# Patient Record
Sex: Female | Born: 1965 | Hispanic: No | Marital: Married
Health system: Southern US, Community
[De-identification: ages and names within clinical notes are randomized; demographics above are authoritative.]

---

## 2012-03-26 ENCOUNTER — Other Ambulatory Visit (HOSPITAL_COMMUNITY)
Admission: RE | Admit: 2012-03-26 | Discharge: 2012-03-26 | Disposition: A | Discharge status: Home / Self Care | Payer: Commercial Indemnity | Source: Ambulatory Visit | Attending: Obstetrics and Gynecology | Admitting: Obstetrics and Gynecology

## 2012-03-26 DIAGNOSIS — Z01419 Encounter for gynecological examination (general) (routine) without abnormal findings: Secondary | ICD-10-CM | POA: Insufficient documentation

## 2012-03-26 DIAGNOSIS — Z1151 Encounter for screening for human papillomavirus (HPV): Secondary | ICD-10-CM | POA: Insufficient documentation

## 2013-04-01 ENCOUNTER — Other Ambulatory Visit (HOSPITAL_COMMUNITY)
Admission: RE | Admit: 2013-04-01 | Discharge: 2013-04-01 | Disposition: A | Discharge status: Home / Self Care | Payer: Commercial Indemnity | Source: Ambulatory Visit | Attending: Obstetrics and Gynecology | Admitting: Obstetrics and Gynecology

## 2013-04-01 ENCOUNTER — Other Ambulatory Visit: Payer: Self-pay | Admitting: Obstetrics and Gynecology

## 2013-04-01 DIAGNOSIS — Z01419 Encounter for gynecological examination (general) (routine) without abnormal findings: Secondary | ICD-10-CM | POA: Insufficient documentation

## 2013-04-01 DIAGNOSIS — Z1151 Encounter for screening for human papillomavirus (HPV): Secondary | ICD-10-CM | POA: Insufficient documentation

## 2014-04-05 ENCOUNTER — Other Ambulatory Visit: Payer: Self-pay | Admitting: Obstetrics and Gynecology

## 2014-04-05 ENCOUNTER — Other Ambulatory Visit (HOSPITAL_COMMUNITY)
Admission: RE | Admit: 2014-04-05 | Discharge: 2014-04-05 | Disposition: A | Discharge status: Home / Self Care | Payer: Managed Care, Other (non HMO) | Source: Ambulatory Visit | Attending: Obstetrics and Gynecology | Admitting: Obstetrics and Gynecology

## 2014-04-05 DIAGNOSIS — Z01419 Encounter for gynecological examination (general) (routine) without abnormal findings: Secondary | ICD-10-CM | POA: Diagnosis present

## 2014-04-07 LAB — CYTOLOGY - PAP

## 2014-07-19 ENCOUNTER — Other Ambulatory Visit: Payer: Self-pay | Admitting: Internal Medicine

## 2014-07-19 ENCOUNTER — Ambulatory Visit
Admission: RE | Admit: 2014-07-19 | Discharge: 2014-07-19 | Disposition: A | Discharge status: Home / Self Care | Payer: Managed Care, Other (non HMO) | Source: Ambulatory Visit | Attending: Internal Medicine | Admitting: Internal Medicine

## 2014-07-19 DIAGNOSIS — J209 Acute bronchitis, unspecified: Secondary | ICD-10-CM

## 2015-11-28 ENCOUNTER — Other Ambulatory Visit: Payer: Self-pay | Admitting: Obstetrics and Gynecology

## 2015-11-28 DIAGNOSIS — Z1231 Encounter for screening mammogram for malignant neoplasm of breast: Secondary | ICD-10-CM

## 2015-11-29 ENCOUNTER — Ambulatory Visit: Payer: Managed Care, Other (non HMO)

## 2015-12-19 ENCOUNTER — Ambulatory Visit
Admission: RE | Admit: 2015-12-19 | Discharge: 2015-12-19 | Disposition: A | Discharge status: Home / Self Care | Payer: Managed Care, Other (non HMO) | Source: Ambulatory Visit | Attending: Obstetrics and Gynecology | Admitting: Obstetrics and Gynecology

## 2015-12-19 DIAGNOSIS — Z1231 Encounter for screening mammogram for malignant neoplasm of breast: Secondary | ICD-10-CM

## 2016-04-11 ENCOUNTER — Other Ambulatory Visit (HOSPITAL_COMMUNITY)
Admission: RE | Admit: 2016-04-11 | Discharge: 2016-04-11 | Disposition: A | Discharge status: Home / Self Care | Payer: BLUE CROSS/BLUE SHIELD | Source: Ambulatory Visit | Attending: Obstetrics and Gynecology | Admitting: Obstetrics and Gynecology

## 2016-04-11 ENCOUNTER — Other Ambulatory Visit: Payer: Self-pay | Admitting: Obstetrics and Gynecology

## 2016-04-11 DIAGNOSIS — Z01419 Encounter for gynecological examination (general) (routine) without abnormal findings: Secondary | ICD-10-CM | POA: Insufficient documentation

## 2016-04-11 DIAGNOSIS — Z1151 Encounter for screening for human papillomavirus (HPV): Secondary | ICD-10-CM | POA: Diagnosis not present

## 2016-04-17 LAB — CYTOLOGY - PAP
Diagnosis: NEGATIVE
HPV (WINDOPATH): NOT DETECTED

## 2018-11-14 ENCOUNTER — Other Ambulatory Visit: Payer: Self-pay | Admitting: Obstetrics and Gynecology

## 2018-11-14 DIAGNOSIS — Z1231 Encounter for screening mammogram for malignant neoplasm of breast: Secondary | ICD-10-CM

## 2018-12-31 ENCOUNTER — Ambulatory Visit
Admission: RE | Admit: 2018-12-31 | Discharge: 2018-12-31 | Disposition: A | Discharge status: Home / Self Care | Payer: BC Managed Care – PPO | Source: Ambulatory Visit | Attending: Obstetrics and Gynecology | Admitting: Obstetrics and Gynecology

## 2018-12-31 ENCOUNTER — Other Ambulatory Visit: Payer: Self-pay

## 2018-12-31 DIAGNOSIS — Z1231 Encounter for screening mammogram for malignant neoplasm of breast: Secondary | ICD-10-CM

## 2019-04-16 ENCOUNTER — Ambulatory Visit: Payer: Managed Care, Other (non HMO) | Attending: Family

## 2019-04-16 DIAGNOSIS — Z23 Encounter for immunization: Secondary | ICD-10-CM

## 2019-04-16 NOTE — Progress Notes (Signed)
   Covid-19 Vaccination Clinic  Name:  Bethany Gallegos    MRN: [id number] DOB: [birth year]  04/16/2019  Ms. Bethany Gallegos was observed post Covid-19 immunization for 15 minutes without incident. She was provided with Vaccine Information Sheet and instruction to access the V-Safe system.   Ms. Bethany Gallegos was instructed to call 911 with any severe reactions post vaccine: Marland Kitchen Difficulty breathing  . Swelling of face and throat  . A fast heartbeat  . A bad rash all over body  . Dizziness and weakness   Immunizations Administered    Name Date Dose VIS Date Route   Moderna COVID-19 Vaccine 04/16/2019 11:37 AM 0.5 mL 01/06/2019 Intramuscular   Manufacturer: Moderna   Lot: [id number]   NDC: 96283-662-94

## 2019-05-19 ENCOUNTER — Ambulatory Visit: Payer: Managed Care, Other (non HMO)

## 2019-05-26 ENCOUNTER — Ambulatory Visit: Payer: Managed Care, Other (non HMO) | Attending: Family

## 2019-05-26 DIAGNOSIS — Z23 Encounter for immunization: Secondary | ICD-10-CM

## 2019-05-26 NOTE — Progress Notes (Signed)
   Covid-19 Vaccination Clinic  Name:  Bethany Gallegos    MRN: [id number] DOB: 08-14-1965  05/26/2019  Ms. Bethany Gallegos was observed post Covid-19 immunization for 15 minutes without incident. She was provided with Vaccine Information Sheet and instruction to access the V-Safe system.   Ms. Bethany Gallegos was instructed to call 911 with any severe reactions post vaccine: Marland Kitchen Difficulty breathing  . Swelling of face and throat  . A fast heartbeat  . A bad rash all over body  . Dizziness and weakness   Immunizations Administered    Name Date Dose VIS Date Route   Moderna COVID-19 Vaccine 05/26/2019 11:23 AM 0.5 mL 01/2019 Intramuscular   Manufacturer: Moderna   Lot: [id number]   NDC: 50871-994-12

## 2020-02-10 ENCOUNTER — Ambulatory Visit: Payer: Managed Care, Other (non HMO) | Attending: Family

## 2020-02-10 DIAGNOSIS — Z23 Encounter for immunization: Secondary | ICD-10-CM

## 2020-06-03 NOTE — Progress Notes (Signed)
   Covid-19 Vaccination Clinic  Name:  Bethany Gallegos    MRN: [id number] DOB: 11-11-1965  06/03/2020  Ms. Bethany Gallegos was observed post Covid-19 immunization for 15 minutes without incident. She was provided with Vaccine Information Sheet and instruction to access the V-Safe system.   Ms. Bethany Gallegos was instructed to call 911 with any severe reactions post vaccine: Marland Kitchen Difficulty breathing  . Swelling of face and throat  . A fast heartbeat  . A bad rash all over body  . Dizziness and weakness   Immunizations Administered    Name Date Dose VIS Date Route   Moderna Covid-19 Booster Vaccine 02/10/2020  2:30 PM 0.25 mL 11/25/2019 Intramuscular   Manufacturer: Moderna   Lot: [id number]   NDC: 23536-144-31

## 2020-11-10 ENCOUNTER — Other Ambulatory Visit: Payer: Self-pay | Admitting: Internal Medicine

## 2020-11-10 DIAGNOSIS — Z1231 Encounter for screening mammogram for malignant neoplasm of breast: Secondary | ICD-10-CM

## 2020-12-09 ENCOUNTER — Other Ambulatory Visit: Payer: Self-pay

## 2020-12-09 ENCOUNTER — Ambulatory Visit
Admission: RE | Admit: 2020-12-09 | Discharge: 2020-12-09 | Disposition: A | Discharge status: Home / Self Care | Payer: BC Managed Care – PPO | Source: Ambulatory Visit | Attending: Internal Medicine | Admitting: Internal Medicine

## 2020-12-09 DIAGNOSIS — Z1231 Encounter for screening mammogram for malignant neoplasm of breast: Secondary | ICD-10-CM

## 2021-01-12 ENCOUNTER — Ambulatory Visit: Payer: BC Managed Care – PPO | Attending: Family

## 2021-01-12 DIAGNOSIS — Z23 Encounter for immunization: Secondary | ICD-10-CM

## 2021-01-12 NOTE — Progress Notes (Signed)
   Covid-19 Vaccination Clinic  Name:  Lizette Pazos    MRN: [id number] DOB: 1965/05/13  01/12/2021  Ms. Irmalee Riemenschneider was observed post Covid-19 immunization for 15 minutes without incident. She was provided with Vaccine Information Sheet and instruction to access the V-Safe system.   Ms. Mianna Iezzi was instructed to call 911 with any severe reactions post vaccine: Difficulty breathing  Swelling of face and throat  A fast heartbeat  A bad rash all over body  Dizziness and weakness   Immunizations Administered     Name Date Dose VIS Date Route   Moderna Covid-19 vaccine Bivalent Booster 01/12/2021  2:45 PM 0.5 mL 09/17/2020 Intramuscular   Manufacturer: Gala Murdoch   Lot: [id number]   NDC: 36468-032-12

## 2021-11-17 ENCOUNTER — Other Ambulatory Visit: Payer: Self-pay | Admitting: Internal Medicine

## 2021-11-17 DIAGNOSIS — E785 Hyperlipidemia, unspecified: Secondary | ICD-10-CM | POA: Diagnosis not present

## 2021-11-17 DIAGNOSIS — Z1231 Encounter for screening mammogram for malignant neoplasm of breast: Secondary | ICD-10-CM

## 2021-11-17 DIAGNOSIS — I10 Essential (primary) hypertension: Secondary | ICD-10-CM | POA: Diagnosis not present

## 2021-11-17 DIAGNOSIS — E1165 Type 2 diabetes mellitus with hyperglycemia: Secondary | ICD-10-CM | POA: Diagnosis not present

## 2021-11-17 DIAGNOSIS — Z23 Encounter for immunization: Secondary | ICD-10-CM | POA: Diagnosis not present

## 2021-11-17 DIAGNOSIS — Z0000 Encounter for general adult medical examination without abnormal findings: Secondary | ICD-10-CM | POA: Diagnosis not present

## 2022-01-02 ENCOUNTER — Ambulatory Visit
Admission: RE | Admit: 2022-01-02 | Discharge: 2022-01-02 | Disposition: A | Discharge status: Home / Self Care | Payer: 59 | Source: Ambulatory Visit | Attending: Internal Medicine | Admitting: Internal Medicine

## 2022-01-02 DIAGNOSIS — Z1231 Encounter for screening mammogram for malignant neoplasm of breast: Secondary | ICD-10-CM | POA: Diagnosis not present

## 2022-02-11 DIAGNOSIS — Z6830 Body mass index (BMI) 30.0-30.9, adult: Secondary | ICD-10-CM | POA: Diagnosis not present

## 2022-02-11 DIAGNOSIS — U071 COVID-19: Secondary | ICD-10-CM | POA: Diagnosis not present

## 2022-03-13 DIAGNOSIS — E785 Hyperlipidemia, unspecified: Secondary | ICD-10-CM | POA: Diagnosis not present

## 2022-03-13 DIAGNOSIS — Z823 Family history of stroke: Secondary | ICD-10-CM | POA: Diagnosis not present

## 2022-03-13 DIAGNOSIS — E1165 Type 2 diabetes mellitus with hyperglycemia: Secondary | ICD-10-CM | POA: Diagnosis not present

## 2022-03-13 DIAGNOSIS — I10 Essential (primary) hypertension: Secondary | ICD-10-CM | POA: Diagnosis not present

## 2022-04-17 IMAGING — MG MM DIGITAL SCREENING BILAT W/ TOMO AND CAD
8 series · 8 of 24 positions shown · non-contrast
Comparison: Previous exam(s).

CLINICAL DATA: Screening.

EXAM:
DIGITAL SCREENING BILATERAL MAMMOGRAM WITH TOMOSYNTHESIS AND CAD
TECHNIQUE: Bilateral screening digital craniocaudal and mediolateral oblique
mammograms were obtained. Bilateral screening digital breast
tomosynthesis was performed. The images were evaluated with
computer-aided detection.

[R CC synth-2D]
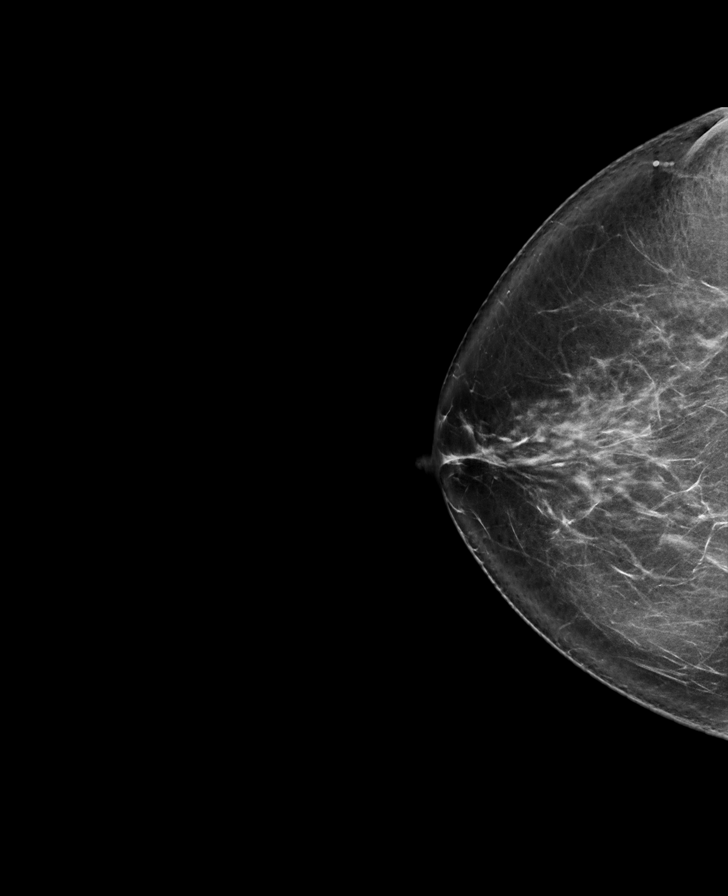

[L CC synth-2D]
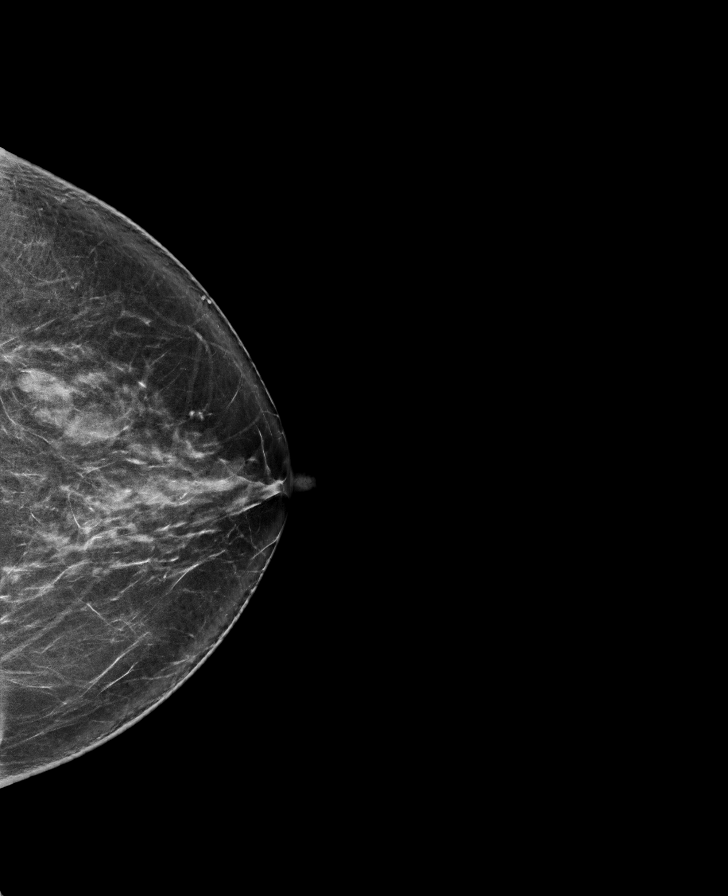

[L MLO synth-2D]
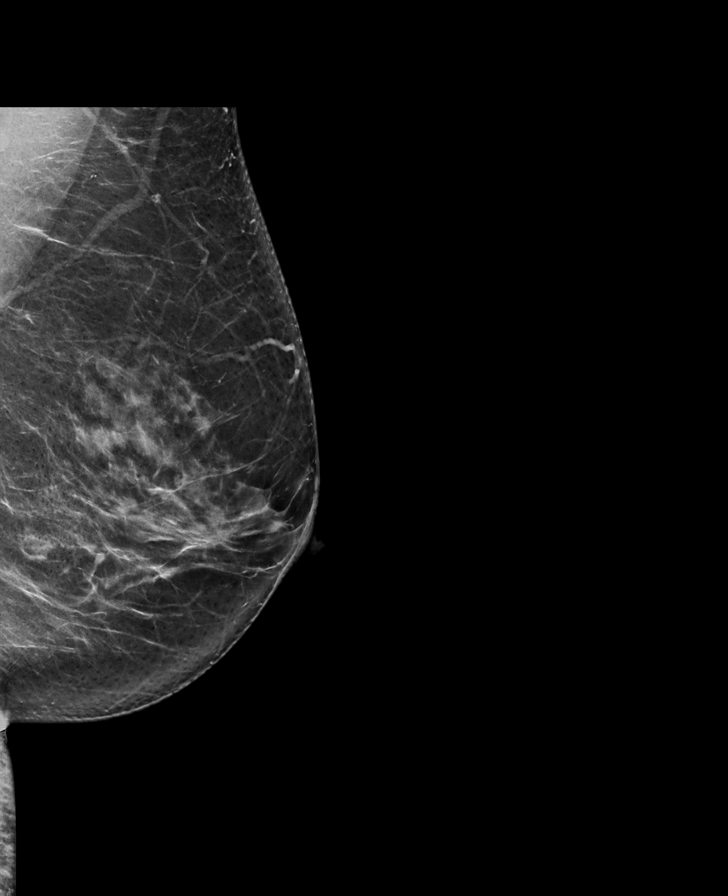

[R MLO synth-2D]
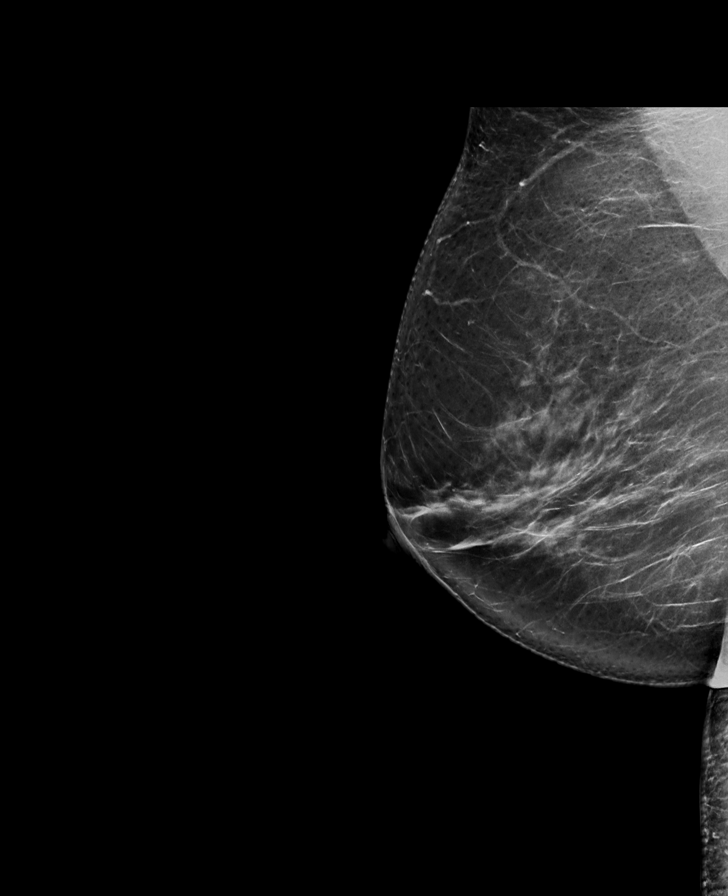

[L MLO tomo · tomo slice 45/88.0]
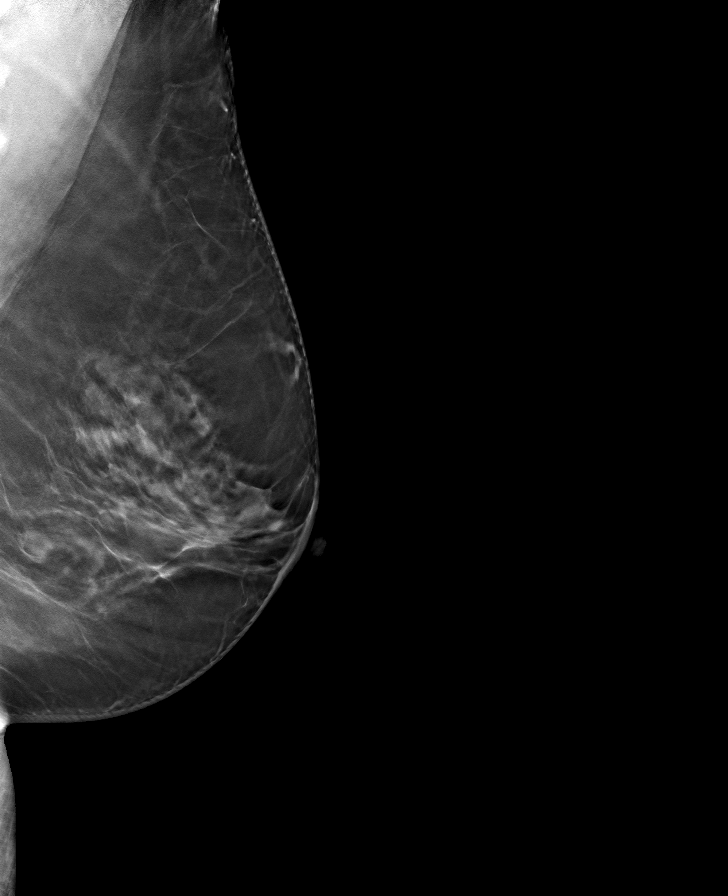

[R MLO tomo · tomo slice 45/89.0]
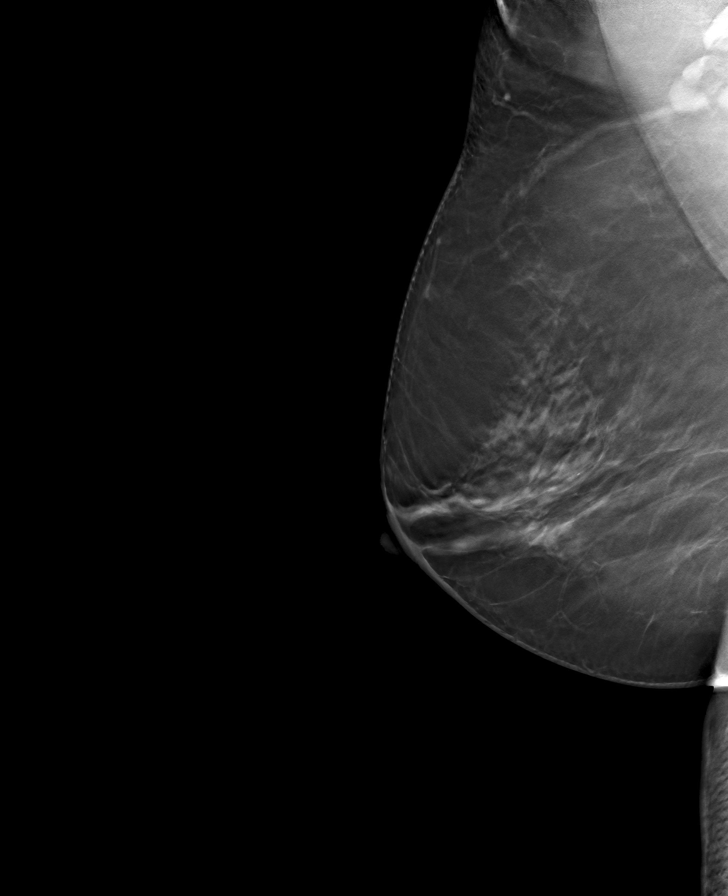

[L CC tomo · tomo slice 41/82.0]
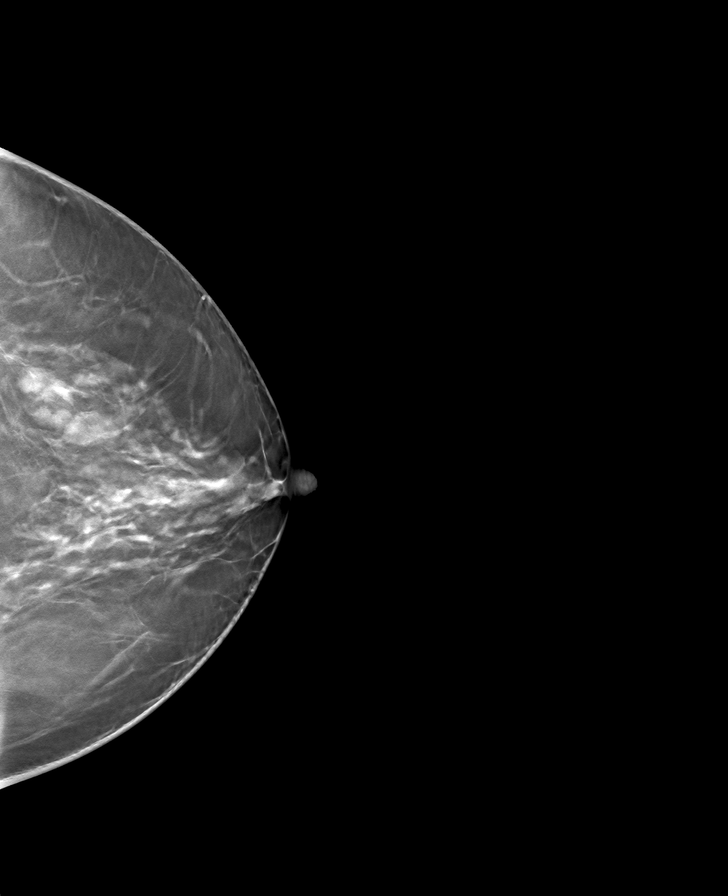

[R CC tomo · tomo slice 43/85.0]
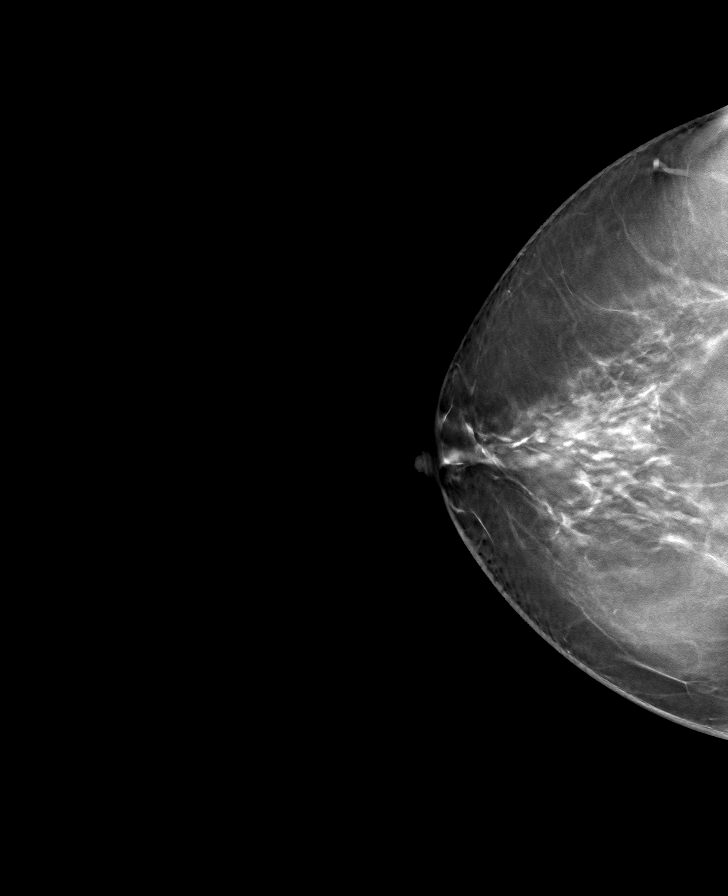

[8 of 24 positions shown; findings below may reference images not displayed]

ACR Breast Density Category c: The breast tissue is heterogeneously
dense, which may obscure small masses.
FINDINGS: There are no findings suspicious for malignancy.
IMPRESSION: No mammographic evidence of malignancy. A result letter of this
screening mammogram will be mailed directly to the patient.

RECOMMENDATION:
Screening mammogram in one year. (Code:[id number])

BI-RADS CATEGORY  1: Negative.

## 2022-11-14 ENCOUNTER — Other Ambulatory Visit: Payer: Self-pay | Admitting: Internal Medicine

## 2022-11-14 DIAGNOSIS — Z1231 Encounter for screening mammogram for malignant neoplasm of breast: Secondary | ICD-10-CM

## 2023-01-09 ENCOUNTER — Ambulatory Visit: Payer: 59

## 2023-01-10 ENCOUNTER — Ambulatory Visit
Admission: RE | Admit: 2023-01-10 | Discharge: 2023-01-10 | Disposition: A | Discharge status: Home / Self Care | Payer: 59 | Source: Ambulatory Visit | Attending: Internal Medicine | Admitting: Internal Medicine

## 2023-01-10 DIAGNOSIS — Z1231 Encounter for screening mammogram for malignant neoplasm of breast: Secondary | ICD-10-CM

## 2023-01-29 DIAGNOSIS — E669 Obesity, unspecified: Secondary | ICD-10-CM | POA: Diagnosis not present

## 2023-01-29 DIAGNOSIS — Z23 Encounter for immunization: Secondary | ICD-10-CM | POA: Diagnosis not present

## 2023-01-29 DIAGNOSIS — E1165 Type 2 diabetes mellitus with hyperglycemia: Secondary | ICD-10-CM | POA: Diagnosis not present

## 2023-01-29 DIAGNOSIS — Z1231 Encounter for screening mammogram for malignant neoplasm of breast: Secondary | ICD-10-CM | POA: Diagnosis not present

## 2023-01-29 DIAGNOSIS — E785 Hyperlipidemia, unspecified: Secondary | ICD-10-CM | POA: Diagnosis not present

## 2023-01-29 DIAGNOSIS — Z0000 Encounter for general adult medical examination without abnormal findings: Secondary | ICD-10-CM | POA: Diagnosis not present

## 2023-01-29 DIAGNOSIS — Z1211 Encounter for screening for malignant neoplasm of colon: Secondary | ICD-10-CM | POA: Diagnosis not present

## 2023-01-29 DIAGNOSIS — I10 Essential (primary) hypertension: Secondary | ICD-10-CM | POA: Diagnosis not present

## 2023-01-29 DIAGNOSIS — J301 Allergic rhinitis due to pollen: Secondary | ICD-10-CM | POA: Diagnosis not present

## 2023-07-30 DIAGNOSIS — E1165 Type 2 diabetes mellitus with hyperglycemia: Secondary | ICD-10-CM | POA: Diagnosis not present

## 2023-07-30 DIAGNOSIS — Z823 Family history of stroke: Secondary | ICD-10-CM | POA: Diagnosis not present

## 2023-07-30 DIAGNOSIS — E785 Hyperlipidemia, unspecified: Secondary | ICD-10-CM | POA: Diagnosis not present

## 2023-07-30 DIAGNOSIS — I10 Essential (primary) hypertension: Secondary | ICD-10-CM | POA: Diagnosis not present

## 2023-11-14 ENCOUNTER — Other Ambulatory Visit: Payer: Self-pay | Admitting: Internal Medicine

## 2023-11-14 DIAGNOSIS — Z1231 Encounter for screening mammogram for malignant neoplasm of breast: Secondary | ICD-10-CM

## 2024-01-13 ENCOUNTER — Ambulatory Visit
Admission: RE | Admit: 2024-01-13 | Discharge: 2024-01-13 | Disposition: A | Source: Ambulatory Visit | Attending: Internal Medicine | Admitting: Internal Medicine

## 2024-01-13 DIAGNOSIS — Z1231 Encounter for screening mammogram for malignant neoplasm of breast: Secondary | ICD-10-CM
